# Patient Record
Sex: Male | Born: 1975 | Race: White | Hispanic: No | Smoking: Never smoker
Health system: Southern US, Community
[De-identification: ages and names within clinical notes are randomized; demographics above are authoritative.]

---

## 2016-01-26 ENCOUNTER — Encounter: Payer: Self-pay | Admitting: Emergency Medicine

## 2016-01-26 ENCOUNTER — Emergency Department: Payer: Self-pay

## 2016-01-26 ENCOUNTER — Emergency Department
Admission: EM | Admit: 2016-01-26 | Discharge: 2016-01-26 | Discharge status: Against Medical Advice | Payer: Self-pay | Attending: Emergency Medicine | Admitting: Emergency Medicine

## 2016-01-26 DIAGNOSIS — M25511 Pain in right shoulder: Secondary | ICD-10-CM | POA: Insufficient documentation

## 2016-01-26 MED ORDER — ONDANSETRON 4 MG PO TBDP
ORAL_TABLET | ORAL | Status: AC
  Administered 2016-01-26: 4 mg via ORAL
  Filled 2016-01-26: qty 1

## 2016-01-26 MED ORDER — HYDROMORPHONE HCL 1 MG/ML IJ SOLN
1.0000 mg | Freq: Once | INTRAMUSCULAR | Status: DC
  Filled 2016-01-26: qty 1

## 2016-01-26 MED ORDER — ONDANSETRON 4 MG PO TBDP
4.0000 mg | ORAL_TABLET | Freq: Once | ORAL | Status: AC
  Administered 2016-01-26: 4 mg via ORAL

## 2016-01-26 MED ORDER — HYDROMORPHONE HCL 1 MG/ML IJ SOLN
1.0000 mg | Freq: Once | INTRAMUSCULAR | Status: AC
  Administered 2016-01-26: 1 mg via INTRAMUSCULAR

## 2016-01-26 NOTE — ED Notes (Signed)
Pt in via triage with complaints of right shoulder pain.  Pt report picking a box up at work and hearing a pop.  Pt w/ hx of shoulder displacement to right shoulder.  Pt A/Ox4, no immediate distress at this time.

## 2016-01-26 NOTE — ED Provider Notes (Signed)
[facility] Emergency Department Provider Note  Time seen: 9:57 PM  I have reviewed the triage vital signs and the nursing notes.   HISTORY  Chief Complaint Shoulder Injury    HPI Carl Alexander is a 40 y.o. male with a past medical history of shoulder dislocation who presents the emergency department with right shoulder pain. According to the patient he was attempting to pick up a box when he felt his shoulder pop out of place. Patient states this happened twice before, feels identical. Denies any trauma to the shoulder. Denies any other injuries. States severe pain 8/10 in the right shoulder dull aching much worse with any attempted movement.     History reviewed. No pertinent past medical history.  There are no active problems to display for this patient.   History reviewed. No pertinent past surgical history.  No current outpatient prescriptions on file.  Allergies Haldol and Toradol  History reviewed. No pertinent family history.  Social History Social History  Substance Use Topics  . Smoking status: Never Smoker   . Smokeless tobacco: None  . Alcohol Use: No    Review of Systems Constitutional: Negative for fever. Cardiovascular: Negative for chest pain. Respiratory: Negative for shortness of breath. Gastrointestinal: Negative for abdominal pain Musculoskeletal: Positive for right shoulder pain. Neurological: Negative for headache 10-point ROS otherwise negative.  ____________________________________________   PHYSICAL EXAM:  VITAL SIGNS: ED Triage Vitals  Enc Vitals Group     BP 01/26/16 2120 130/89 mmHg     Pulse Rate 01/26/16 2120 104     Resp 01/26/16 2120 22     Temp 01/26/16 2120 98.6 F (37 C)     Temp Source 01/26/16 2120 Oral     SpO2 01/26/16 2120 98 %     Weight 01/26/16 2120 301 lb (136.533 kg)     Height 01/26/16 2120 5\' 9"  (1.753 m)     Head Cir --      Peak Flow --      Pain Score 01/26/16 2121 10     Pain  Loc --      Pain Edu? --      Excl. in GC? --     Constitutional: Alert and oriented. Well appearing and in no distress. Eyes: Normal exam ENT   Head: Normocephalic and atraumatic.   Mouth/Throat: Mucous membranes are moist. Cardiovascular: Normal rate, regular rhythm. No murmur Respiratory: Normal respiratory effort without tachypnea nor retractions. Breath sounds are clear  Gastrointestinal: Soft and nontender. No distention.   Musculoskeletal: Right shoulder pain. Neurologic:  Normal speech and language. No gross focal neurologic deficits. Neurovascular intact distally in the right arm. 2+ radial pulse. Sensation intact and equal. Good grip strength. Skin:  Skin is warm, dry and intact.  Psychiatric: Mood and affect are normal.   ____________________________________________     RADIOLOGY  X-ray negative.  ____________________________________________   INITIAL IMPRESSION / ASSESSMENT AND PLAN / ED COURSE  Pertinent labs & imaging results that were available during my care of the patient were reviewed by me and considered in my medical decision making (see chart for details).  Patient presents the emergency department with right shoulder pain. Patient's body habitus is difficult to assess for deformity. Patient does have moderate tenderness palpation of the right shoulder and pain with any attempted range of motion. Range of motion very limited due to pain. We will dose pain medication and obtain an x-ray ankles and monitor in the emergency department.  Patient has eloped  from the emergency department shortly after receiving his IM shot of Dilaudid. X-ray has resulting showing no shoulder dislocation. We have attempted to contact the patient without success.  ____________________________________________   FINAL CLINICAL IMPRESSION(S) / ED DIAGNOSES  Right shoulder pain   Minna AntisKevin Yarenis Cerino, MD 01/26/16 [phone]

## 2016-01-26 NOTE — ED Notes (Signed)
Notified by CT that a gentleman was seen leaving.  Upon checking on pt, room is empty.  It appears as if pt has left.  MD aware.

## 2016-01-26 NOTE — ED Notes (Signed)
Pt arrived to the ED for a dislocated right shoulder. Pt is AOx4 in moderate pain distress.

## 2016-01-26 NOTE — ED Notes (Signed)
Patient transported to X-ray 

## 2017-08-15 IMAGING — CR DG SHOULDER 2+V*R*
1 series · 2 of 2 positions shown · non-contrast
Comparison: None.

CLINICAL DATA: Right shoulder pain after lifting a box at work.

EXAM:
RIGHT SHOULDER - 2+ VIEW

[Series 1: w shoulder external right · 0.14mm/px · 2 of 2 slices shown]
[im 1/2]
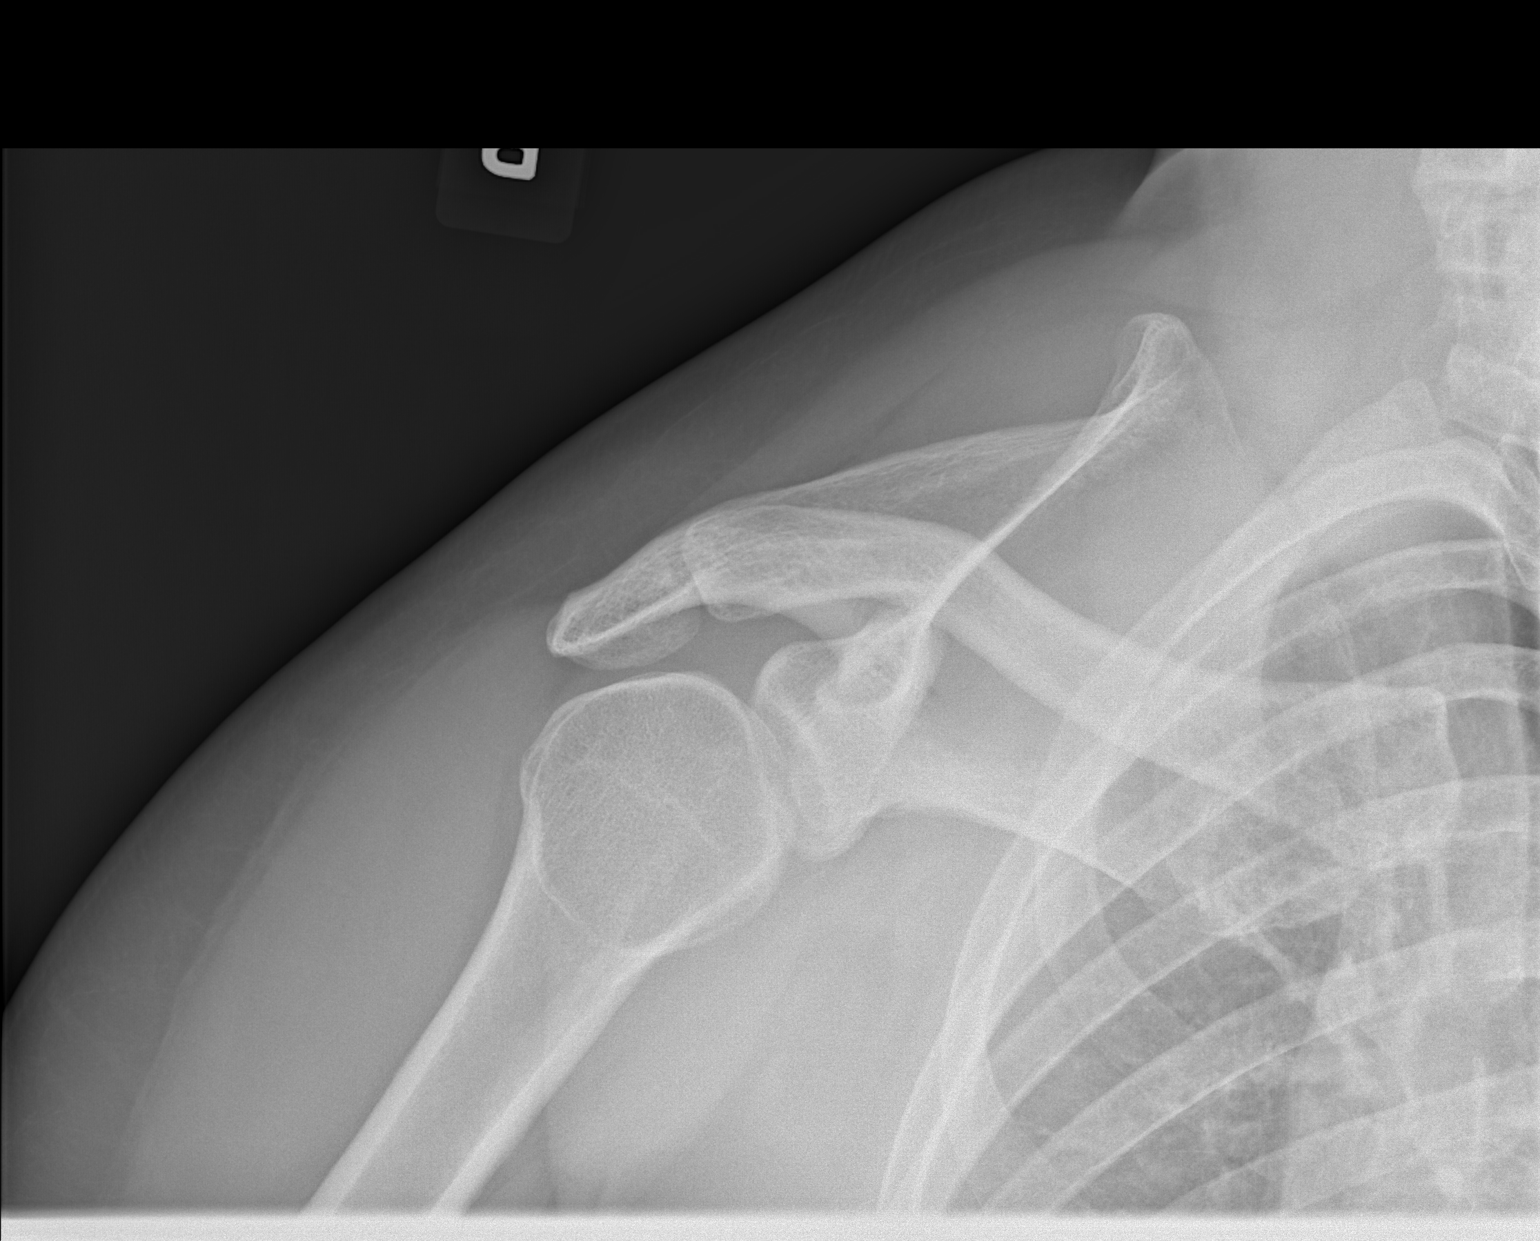
[im 2/2]
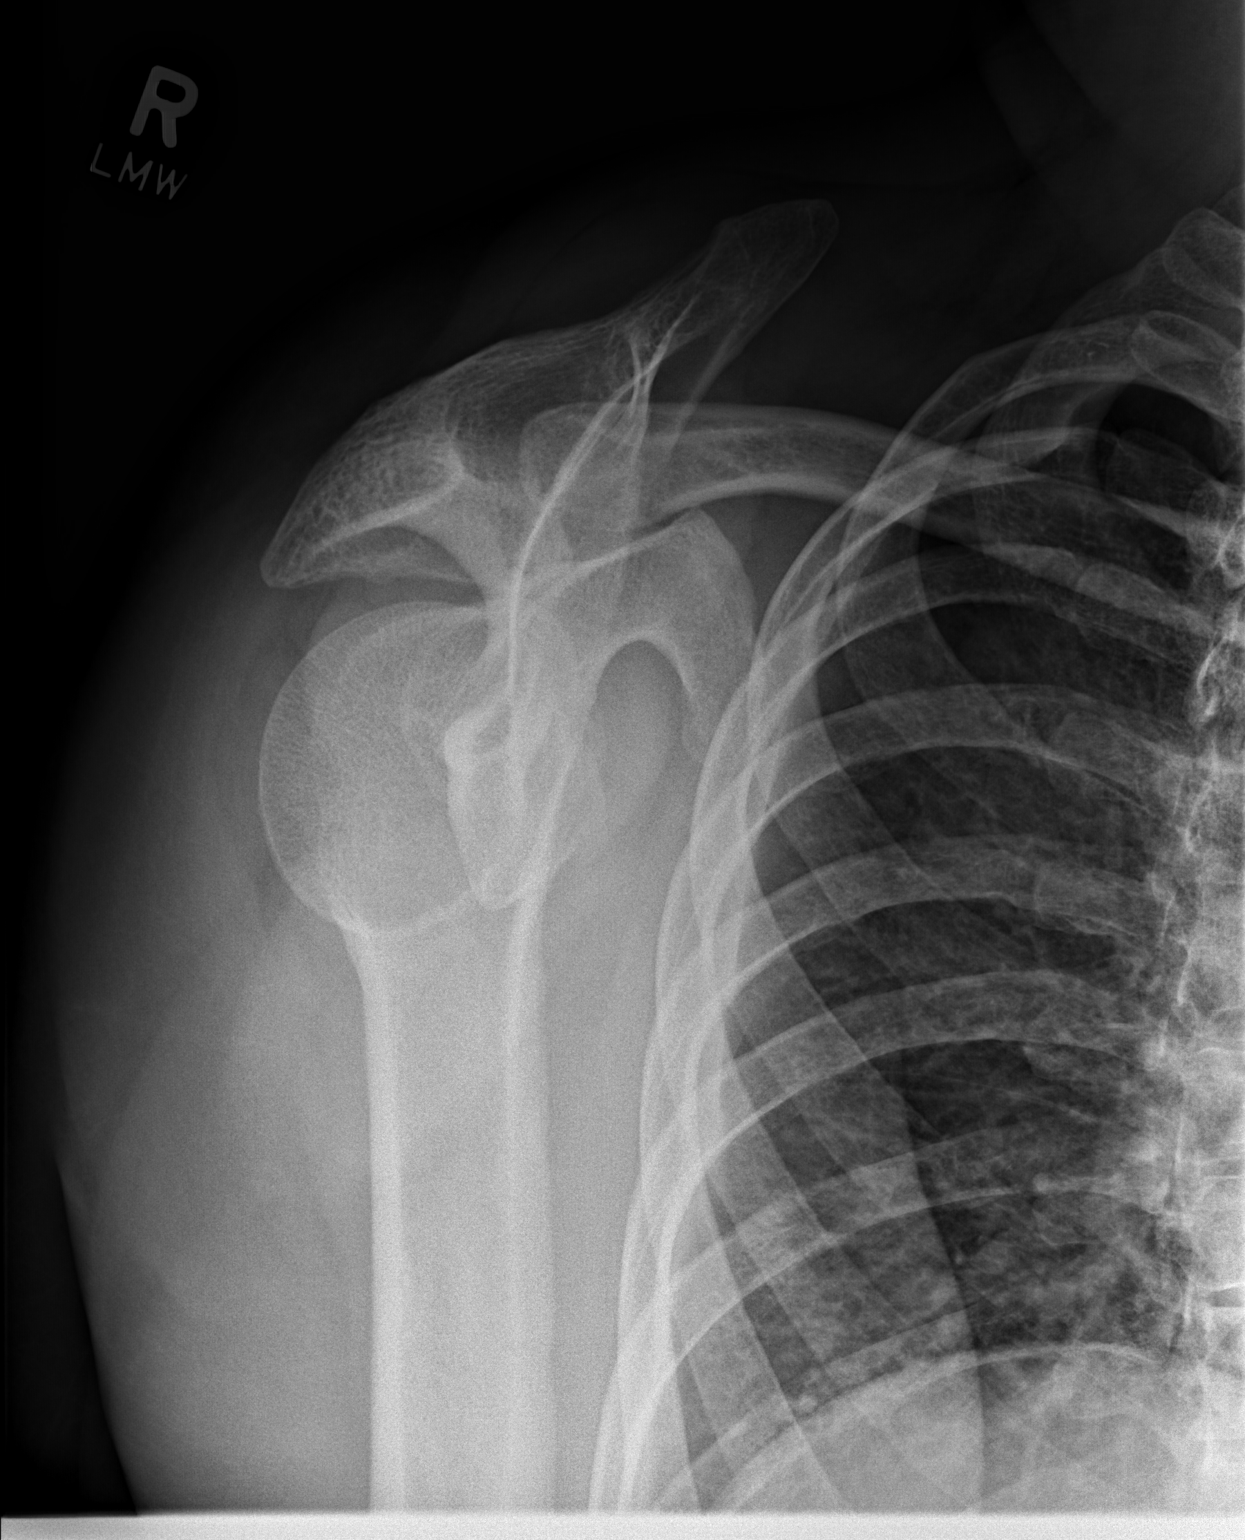

[2 of 2 positions shown; findings below may reference images not displayed]

FINDINGS: There is no evidence of fracture or dislocation. There is no
evidence of arthropathy or other focal bone abnormality. Soft
tissues are unremarkable.
IMPRESSION: Negative.
